# Patient Record
Sex: Female | Born: 1990 | Race: Black or African American | Hispanic: No | Marital: Single | Smoking: Never smoker
Health system: Southern US, Community
[De-identification: ages and names within clinical notes are randomized; demographics above are authoritative.]

## PROBLEM LIST (undated history)

## (undated) ENCOUNTER — Inpatient Hospital Stay (HOSPITAL_COMMUNITY): Payer: Self-pay

## (undated) DIAGNOSIS — J45909 Unspecified asthma, uncomplicated: Secondary | ICD-10-CM

## (undated) HISTORY — DX: Unspecified asthma, uncomplicated: J45.909

---
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
## 2012-06-13 ENCOUNTER — Emergency Department (HOSPITAL_COMMUNITY)
Admission: EM | Admit: 2012-06-13 | Discharge: 2012-06-14 | Disposition: A | Discharge status: Home / Self Care | Payer: 59 | Attending: Emergency Medicine | Admitting: Emergency Medicine

## 2012-06-13 ENCOUNTER — Emergency Department (HOSPITAL_COMMUNITY): Payer: 59

## 2012-06-13 ENCOUNTER — Encounter (HOSPITAL_COMMUNITY): Payer: Self-pay | Admitting: Emergency Medicine

## 2012-06-13 DIAGNOSIS — S199XXA Unspecified injury of neck, initial encounter: Secondary | ICD-10-CM | POA: Insufficient documentation

## 2012-06-13 DIAGNOSIS — S0993XA Unspecified injury of face, initial encounter: Secondary | ICD-10-CM | POA: Insufficient documentation

## 2012-06-13 DIAGNOSIS — Y9389 Activity, other specified: Secondary | ICD-10-CM | POA: Insufficient documentation

## 2012-06-13 DIAGNOSIS — Z8739 Personal history of other diseases of the musculoskeletal system and connective tissue: Secondary | ICD-10-CM | POA: Insufficient documentation

## 2012-06-13 DIAGNOSIS — S8990XA Unspecified injury of unspecified lower leg, initial encounter: Secondary | ICD-10-CM | POA: Insufficient documentation

## 2012-06-13 DIAGNOSIS — S79919A Unspecified injury of unspecified hip, initial encounter: Secondary | ICD-10-CM | POA: Insufficient documentation

## 2012-06-13 DIAGNOSIS — Y92410 Unspecified street and highway as the place of occurrence of the external cause: Secondary | ICD-10-CM | POA: Insufficient documentation

## 2012-06-13 MED ORDER — HYDROCODONE-ACETAMINOPHEN 5-325 MG PO TABS: 1.0000 | ORAL_TABLET | Freq: Four times a day (QID) | ORAL | Status: DC | PRN
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
## 2012-06-13 NOTE — ED Notes (Signed)
Rx x 1.  Pt voiced understanding to f/u with PCP and return for worsening condition.  
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
## 2012-06-15 NOTE — ED Provider Notes (Signed)
Medical screening examination/treatment/procedure(s) were performed by non-physician practitioner and as supervising physician I was immediately available for consultation/collaboration.    Anahli Arvanitis W. Deyanira Fesler, MD 06/15/12 1315 
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
## 2014-01-25 ENCOUNTER — Ambulatory Visit (INDEPENDENT_AMBULATORY_CARE_PROVIDER_SITE_OTHER): Payer: 59 | Admitting: Emergency Medicine

## 2014-01-25 VITALS — BP 110/80 | HR 93 | Temp 98.0°F | Resp 16 | Ht 64.5 in | Wt 97.2 lb

## 2014-01-25 DIAGNOSIS — M25521 Pain in right elbow: Secondary | ICD-10-CM

## 2014-01-25 DIAGNOSIS — L02419 Cutaneous abscess of limb, unspecified: Secondary | ICD-10-CM

## 2014-01-25 DIAGNOSIS — M79621 Pain in right upper arm: Secondary | ICD-10-CM

## 2014-01-25 MED ORDER — SULFAMETHOXAZOLE-TRIMETHOPRIM 400-80 MG PO TABS: 1.0000 | ORAL_TABLET | Freq: Two times a day (BID) | ORAL | Status: DC

## 2014-01-25 NOTE — Patient Instructions (Signed)
Continue the antibiotic as prescribed. Apply a warm compress to the area for 15-20 minutes 2-4 times each day. Change the dressing if it becomes saturated, leaks or falls off.  

## 2014-01-25 NOTE — Progress Notes (Signed)
Verbal Consent Obtained. Local anesthesia with 1 cc of 2% lidocaine plain.  11 blade used to incise the lesion centrally.  Purulence expressed. Irrigated wound with 3 cc of 2% lidocaine and packed with 1/4 inch plain packing.  Cleansed and dressed.

## 2014-01-25 NOTE — Progress Notes (Signed)
[facility] [address], [place] [address] [phone]  Date:  01/25/2014   Name:  Debra Schneider   DOB:  June 13, 1990   MRN:  [id number]  PCP:  No PCP Per Patient    Chief Complaint: Abscess   History of Present Illness:  Debra Schneider is a 23 y.o. very pleasant female patient who presents with the following:  Painful mass on right upper arm for past week No fever or chills.  No history of injury No improvement with over the counter medications or other home remedies.  Denies other complaint or health concern today.   There are no active problems to display for this patient.   Past Medical History  Diagnosis Date  . Arthritis   . Asthma     History reviewed. No pertinent past surgical history.  History  Substance Use Topics  . Smoking status: Never Smoker   . Smokeless tobacco: Not on file  . Alcohol Use: 0.0 oz/week    0 Not specified per week     Comment: weekends    History reviewed. No pertinent family history.  No Known Allergies  Medication list has been reviewed and updated.  No current outpatient prescriptions on file prior to visit.   No current facility-administered medications on file prior to visit.    Review of Systems:  As per HPI, otherwise negative.    Physical Examination: Filed Vitals:   01/25/14 1847  BP: 110/80  Pulse: 93  Temp: 98 F (36.7 C)  Resp: 16   Filed Vitals:   01/25/14 1847  Height: 5' 4.5" (1.638 m)  Weight: 97 lb 3.2 oz (44.09 kg)   Body mass index is 16.43 kg/(m^2). Ideal Body Weight: Weight in (lb) to have BMI = 25: 147.6   GEN: WDWN, NAD, Non-toxic, Alert & Oriented x 3 HEENT: Atraumatic, Normocephalic.  Ears and Nose: No external deformity. EXTR: No clubbing/cyanosis/edema NEURO: Normal gait.  PSYCH: Normally interactive. Conversant. Not depressed or anxious appearing.  Calm demeanor.  Abscess right upper arm  Assessment and Plan: Pain arm Abscess  arm Septra  Signed,  Phillips OdorJeffery Kensley Lares, MD

## 2014-01-27 ENCOUNTER — Ambulatory Visit (INDEPENDENT_AMBULATORY_CARE_PROVIDER_SITE_OTHER): Payer: 59 | Admitting: Physician Assistant

## 2014-01-27 VITALS — BP 100/64 | HR 98 | Temp 98.4°F | Ht 64.5 in | Wt 95.0 lb

## 2014-01-27 DIAGNOSIS — L02419 Cutaneous abscess of limb, unspecified: Secondary | ICD-10-CM

## 2014-01-27 NOTE — Progress Notes (Signed)
   Subjective:    Patient ID: Mcarthur RossettiBianca M Burress, female    DOB: 06-29-1990, 23 y.o.   MRN: [id number]   PCP: No PCP Per Patient  Chief Complaint  Patient presents with  . Wound Check    No Known Allergies  There are no active problems to display for this patient.   Prior to Admission medications   Medication Sig Start Date End Date Taking? Authorizing Provider  sulfamethoxazole-trimethoprim (BACTRIM) 400-80 MG per tablet Take 1 tablet by mouth 2 (two) times daily. 01/25/14   Carmelina DaneJeffery S Anderson, MD    Medical, Surgical, Family and Social History reviewed and updated.  HPI Patient presents for wound care, s/p I&D of an abscess on the RIGHT upper arm 2 days ago.  She's doing well, tolerating the antibiotic.  She's a bit sore at the site, "From everybody wanting to hug me" yesterday at the Thanksgiving meal. No fever or chills. No GI symptoms. The bandage is a bit itchy.   Review of Systems     Objective:   Physical Exam  BP 100/64 mmHg  Pulse 98  Temp(Src) 98.4 F (36.9 C) (Oral)  Ht 5' 4.5" (1.638 m)  Wt 95 lb (43.092 kg)  BMI 16.06 kg/m2  SpO2 100%  LMP 01/13/2014 WDWNBF, A&O x 3. Dressing and packing removed. Purulence expressed. Moderate yellow eschar on the anterior wall of the wound cavity removed with pick ups. Wound cavity is bloody. Gently repacked with 1/4 inch plain packing. Dressed.      Assessment & Plan:  1. Abscess of arm Continue current treatment. RTC 2 days for wound care.   Fernande Brashelle S. Linkoln Alkire, PA-C Physician Assistant-Certified [facility]

## 2014-01-29 ENCOUNTER — Ambulatory Visit (INDEPENDENT_AMBULATORY_CARE_PROVIDER_SITE_OTHER): Payer: 59 | Admitting: Physician Assistant

## 2014-01-29 VITALS — BP 102/70 | HR 97 | Temp 98.4°F | Resp 16

## 2014-01-29 DIAGNOSIS — L02419 Cutaneous abscess of limb, unspecified: Secondary | ICD-10-CM

## 2014-01-29 LAB — WOUND CULTURE
GRAM STAIN: NONE SEEN
Gram Stain: NONE SEEN

## 2014-01-29 NOTE — Progress Notes (Signed)
I have examined this patient along with Mr. McVeigh, PA-C and agree.  

## 2014-01-29 NOTE — Progress Notes (Signed)
   Subjective:    Patient ID: Mcarthur RossettiBianca M Meter, female    DOB: [birth year], 23 y.o.   MRN: [id number]  HPI  [age] yof presents for return visit for wound care.   Seen here 4 days ago, right upper arm abscess was I&Dd and packed. Started on bactrim. 2 days ago seen again, healing well, re-packed.   Today she states has been doing well. Occasionally doing warm compresses. No pain other than when family hugs her (Thanksgiving). Has not changed the dressing. Denies fever, chills. Taking bactrim as prescribed.   Review of Systems See HPI    Objective:   Physical Exam  Constitutional:  BP 102/70 mmHg  Pulse 97  Temp(Src) 98.4 F (36.9 C) (Oral)  Resp 16  SpO2 98%  LMP 01/13/2014   Skin:  Packing removed from wound. No purulence expressed. Small amnt eschar distal portion of wound opening. Removed. Wound still bloody. No surrounding erythema. No fluctuance. 4 cm x 2 cm surrounding induration.       Assessment & Plan:   [age] yof presents for return visit for wound care.   Abscess of arm --Healing well --Packing removed, no sign ongoing infx --Cleaned and dressed, not repacked --Wound care instructions given --continue abx --rtc if purulence, fever/chills, increased pain  Donnajean Lopesodd M. Rosalee Tolley, PA-C Physician Assistant-Certified [facility]  01/29/2014 12:11 PM

## 2014-01-29 NOTE — Patient Instructions (Signed)
Your wound looks good! There was no pus today.  We didn't repack the wound today.  Make sure you are taking the antibiotic twice daily until the course is done.  Keep the bandage on for the next 24-48 hours. Wash the wound when you remove it, then you can use bandaids moving forward.  Come back in if you notice pus coming from it, or start to have increased pain or fever/chills.

## 2015-08-27 ENCOUNTER — Emergency Department (HOSPITAL_BASED_OUTPATIENT_CLINIC_OR_DEPARTMENT_OTHER)
Admission: EM | Admit: 2015-08-27 | Discharge: 2015-08-27 | Disposition: A | Discharge status: Home / Self Care | Payer: Managed Care, Other (non HMO) | Attending: Emergency Medicine | Admitting: Emergency Medicine

## 2015-08-27 ENCOUNTER — Encounter (HOSPITAL_BASED_OUTPATIENT_CLINIC_OR_DEPARTMENT_OTHER): Payer: Self-pay

## 2015-08-27 DIAGNOSIS — Y92410 Unspecified street and highway as the place of occurrence of the external cause: Secondary | ICD-10-CM | POA: Insufficient documentation

## 2015-08-27 DIAGNOSIS — M1990 Unspecified osteoarthritis, unspecified site: Secondary | ICD-10-CM | POA: Insufficient documentation

## 2015-08-27 DIAGNOSIS — Y9389 Activity, other specified: Secondary | ICD-10-CM | POA: Diagnosis not present

## 2015-08-27 DIAGNOSIS — S0993XA Unspecified injury of face, initial encounter: Secondary | ICD-10-CM | POA: Diagnosis present

## 2015-08-27 DIAGNOSIS — J45909 Unspecified asthma, uncomplicated: Secondary | ICD-10-CM | POA: Insufficient documentation

## 2015-08-27 DIAGNOSIS — S0083XA Contusion of other part of head, initial encounter: Secondary | ICD-10-CM | POA: Insufficient documentation

## 2015-08-27 DIAGNOSIS — Y999 Unspecified external cause status: Secondary | ICD-10-CM | POA: Insufficient documentation

## 2015-08-27 MED ORDER — NAPROXEN 375 MG PO TABS: 375.0000 mg | ORAL_TABLET | Freq: Two times a day (BID) | ORAL | Status: AC

## 2015-08-27 MED ORDER — CYCLOBENZAPRINE HCL 5 MG PO TABS: 5.0000 mg | ORAL_TABLET | Freq: Two times a day (BID) | ORAL | Status: AC | PRN
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
## 2015-11-20 ENCOUNTER — Inpatient Hospital Stay (HOSPITAL_COMMUNITY): Payer: Self-pay

## 2015-11-20 ENCOUNTER — Encounter (HOSPITAL_COMMUNITY): Payer: Self-pay | Admitting: *Deleted

## 2015-11-20 ENCOUNTER — Inpatient Hospital Stay (HOSPITAL_COMMUNITY)
Admission: AD | Admit: 2015-11-20 | Discharge: 2015-11-20 | Disposition: A | Discharge status: Home / Self Care | Payer: Self-pay | Source: Ambulatory Visit | Attending: Obstetrics and Gynecology | Admitting: Obstetrics and Gynecology

## 2015-11-20 DIAGNOSIS — O209 Hemorrhage in early pregnancy, unspecified: Secondary | ICD-10-CM

## 2015-11-20 DIAGNOSIS — O021 Missed abortion: Secondary | ICD-10-CM | POA: Insufficient documentation

## 2015-11-20 DIAGNOSIS — Z3A10 10 weeks gestation of pregnancy: Secondary | ICD-10-CM | POA: Insufficient documentation

## 2015-11-20 LAB — WET PREP, GENITAL
SPERM: NONE SEEN
TRICH WET PREP: NONE SEEN
YEAST WET PREP: NONE SEEN

## 2015-11-20 LAB — URINALYSIS, ROUTINE W REFLEX MICROSCOPIC
BILIRUBIN URINE: NEGATIVE
Glucose, UA: NEGATIVE mg/dL
Hgb urine dipstick: NEGATIVE
Ketones, ur: 15 mg/dL — AB
Leukocytes, UA: NEGATIVE
NITRITE: NEGATIVE
PH: 5.5 (ref 5.0–8.0)
Protein, ur: NEGATIVE mg/dL

## 2015-11-20 LAB — CBC
HEMATOCRIT: 39.7 % (ref 36.0–46.0)
HEMOGLOBIN: 13.8 g/dL (ref 12.0–15.0)
MCH: 26.5 pg (ref 26.0–34.0)
MCHC: 34.8 g/dL (ref 30.0–36.0)
MCV: 76.2 fL — AB (ref 78.0–100.0)
PLATELETS: 245 10*3/uL (ref 150–400)
RBC: 5.21 MIL/uL — AB (ref 3.87–5.11)
RDW: 13.1 % (ref 11.5–15.5)
WBC: 8.2 10*3/uL (ref 4.0–10.5)

## 2015-11-20 LAB — HCG, QUANTITATIVE, PREGNANCY: hCG, Beta Chain, Quant, S: 5900 m[IU]/mL — ABNORMAL HIGH (ref <5)

## 2015-11-20 LAB — POCT PREGNANCY, URINE: Preg Test, Ur: POSITIVE — AB

## 2015-11-20 LAB — ABO/RH: ABO/RH(D): O POS
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
## 2015-11-20 NOTE — [facility] Provider Note (Signed)
History     CSN: [id number]  Arrival date and time: 11/20/15 1533   First Provider Initiated Contact with Patient 11/20/15 1747      Chief Complaint  Patient presents with  . Vaginal Bleeding   HPI Debra Schneider is a 25 y.o. G1P0 at [redacted]w[redacted]d by LMP who presents with vaginal bleeding. Reports pink spotting on toilet paper when she voids today. Denies heavy vaginal bleeding or abdominal pain. Last intercourse 2 days ago. Denies n/v/d, constipation, dysuria, or vaginal discharge.   OB History    Gravida Para Term Preterm AB Living   1             SAB TAB Ectopic Multiple Live Births                  Past Medical History:  Diagnosis Date  . Asthma     History reviewed. No pertinent surgical history.  History reviewed. No pertinent family history.  Social History  Substance Use Topics  . Smoking status: Never Smoker  . Smokeless tobacco: Not on file  . Alcohol use 0.0 oz/week     Comment: social    Allergies: No Known Allergies  Prescriptions Prior to Admission  Medication Sig Dispense Refill Last Dose  . cyclobenzaprine (FLEXERIL) 5 MG tablet Take 1 tablet (5 mg total) by mouth 2 (two) times daily as needed for muscle spasms. 12 tablet 0   . naproxen (NAPROSYN) 375 MG tablet Take 1 tablet (375 mg total) by mouth 2 (two) times daily. 20 tablet 0     Review of Systems  Constitutional: Negative.   Gastrointestinal: Negative.   Genitourinary: Negative for dysuria.       + vaginal spotting   Physical Exam   Blood pressure 133/67, pulse 91, temperature 98.7 F (37.1 C), temperature source Oral, resp. rate 16, height 5\' 2"  (1.575 m), weight 102 lb (46.3 kg), last menstrual period 09/11/2015.  Physical Exam  Nursing note and vitals reviewed. Constitutional: She is oriented to person, place, and time. She appears well-developed and well-nourished. No distress.  HENT:  Head: Normocephalic and atraumatic.  Eyes: Conjunctivae are normal. Right eye exhibits no  discharge. Left eye exhibits no discharge. No scleral icterus.  Neck: Normal range of motion.  Cardiovascular: Normal rate, regular rhythm and normal heart sounds.   No murmur heard. Respiratory: Effort normal and breath sounds normal. No respiratory distress. She has no wheezes.  GI: Soft. She exhibits no distension. There is no tenderness.  Genitourinary: Uterus normal. Cervix exhibits no motion tenderness and no friability. Right adnexum displays no mass and no tenderness. Left adnexum displays no mass and no tenderness. Vaginal discharge (small amount of pink discharge, no active bleeding) found.  Genitourinary Comments: Cervix closed  Neurological: She is alert and oriented to person, place, and time.  Skin: Skin is warm and dry. She is not diaphoretic.  Psychiatric: She has a normal mood and affect. Her behavior is normal. Judgment and thought content normal.    MAU Course  Procedures Results for orders placed or performed during the hospital encounter of 11/20/15 (from the past 24 hour(s))  Urinalysis, Routine w reflex microscopic (not at [facility])     Status: Abnormal   Collection Time: 11/20/15  3:47 PM  Result Value Ref Range   Color, Urine YELLOW YELLOW   APPearance CLEAR CLEAR   Specific Gravity, Urine >1.030 (H) 1.005 - 1.030   pH 5.5 5.0 - 8.0   Glucose, UA NEGATIVE NEGATIVE mg/dL  Hgb urine dipstick NEGATIVE NEGATIVE   Bilirubin Urine NEGATIVE NEGATIVE   Ketones, ur 15 (A) NEGATIVE mg/dL   Protein, ur NEGATIVE NEGATIVE mg/dL   Nitrite NEGATIVE NEGATIVE   Leukocytes, UA NEGATIVE NEGATIVE  Pregnancy, urine POC     Status: Abnormal   Collection Time: 11/20/15  4:44 PM  Result Value Ref Range   Preg Test, Ur POSITIVE (A) NEGATIVE  CBC     Status: Abnormal   Collection Time: 11/20/15  5:13 PM  Result Value Ref Range   WBC 8.2 4.0 - 10.5 K/uL   RBC 5.21 (H) 3.87 - 5.11 MIL/uL   Hemoglobin 13.8 12.0 - 15.0 g/dL   HCT 81.1 91.4 - 78.2 %   MCV 76.2 (L) 78.0 - 100.0 fL    MCH 26.5 26.0 - 34.0 pg   MCHC 34.8 30.0 - 36.0 g/dL   RDW 95.6 21.3 - 08.6 %   Platelets 245 150 - 400 K/uL  ABO/Rh     Status: None (Preliminary result)   Collection Time: 11/20/15  5:13 PM  Result Value Ref Range   ABO/RH(D) O POS   hCG, quantitative, pregnancy     Status: Abnormal   Collection Time: 11/20/15  5:13 PM  Result Value Ref Range   hCG, Beta Chain, Quant, S 5,900 (H) <5 mIU/mL  Wet prep, genital     Status: Abnormal   Collection Time: 11/20/15  5:55 PM  Result Value Ref Range   Yeast Wet Prep HPF POC NONE SEEN NONE SEEN   Trich, Wet Prep NONE SEEN NONE SEEN   Clue Cells Wet Prep HPF POC PRESENT (A) NONE SEEN   WBC, Wet Prep HPF POC MODERATE (A) NONE SEEN   Sperm NONE SEEN    US Ob Comp Less 14 Wks  Result Date: 11/20/2015 CLINICAL DATA:  Acute onset of vaginal bleeding.  Initial encounter. EXAM: OBSTETRIC <14 WK [place] AND TRANSVAGINAL OB US TECHNIQUE: Both transabdominal and transvaginal ultrasound examinations were performed for complete evaluation of the gestation as well as the maternal uterus, adnexal regions, and pelvic cul-de-sac. Transvaginal technique was performed to assess early pregnancy. COMPARISON:  Pelvic radiograph performed 06/13/2012 FINDINGS: Intrauterine gestational sac: Single; visualized and normal in shape. Yolk sac:  No Embryo:  Yes Cardiac Activity: No Heart Rate: N/A CRL:  1.28 cm   7 w   3 d Subchorionic hemorrhage:  None visualized. Maternal uterus/adnexae: The uterus is otherwise unremarkable in appearance. A small amount of free fluid is noted at the cervical canal, likely corresponding to the patient's vaginal bleeding. The ovaries are unremarkable in appearance. The right ovary measures 3.8 x 1.4 x 1.6 cm, while the left ovary measures 2.8 x 2.4 x 2.3 cm. No suspicious adnexal masses are seen; there is no evidence for ovarian torsion No free fluid is seen within the pelvic cul-de-sac. IMPRESSION: Single intrauterine pregnancy noted, with a crown-rump  length of 1.3 cm, without evidence of cardiac activity, compatible with fetal demise. If quantitative HCG level continues to rise, follow-up pelvic ultrasound could be considered. Electronically Signed   By: Roanna Raider M.D.   On: 11/20/2015 19:36   US Ob Transvaginal  Result Date: 11/20/2015 CLINICAL DATA:  Acute onset of vaginal bleeding.  Initial encounter. EXAM: OBSTETRIC <14 WK [place] AND TRANSVAGINAL OB US TECHNIQUE: Both transabdominal and transvaginal ultrasound examinations were performed for complete evaluation of the gestation as well as the maternal uterus, adnexal regions, and pelvic cul-de-sac. Transvaginal technique was performed to assess early pregnancy.  COMPARISON:  Pelvic radiograph performed 06/13/2012 FINDINGS: Intrauterine gestational sac: Single; visualized and normal in shape. Yolk sac:  No Embryo:  Yes Cardiac Activity: No Heart Rate: N/A CRL:  1.28 cm   7 w   3 d Subchorionic hemorrhage:  None visualized. Maternal uterus/adnexae: The uterus is otherwise unremarkable in appearance. A small amount of free fluid is noted at the cervical canal, likely corresponding to the patient's vaginal bleeding. The ovaries are unremarkable in appearance. The right ovary measures 3.8 x 1.4 x 1.6 cm, while the left ovary measures 2.8 x 2.4 x 2.3 cm. No suspicious adnexal masses are seen; there is no evidence for ovarian torsion No free fluid is seen within the pelvic cul-de-sac. IMPRESSION: Single intrauterine pregnancy noted, with a crown-rump length of 1.3 cm, without evidence of cardiac activity, compatible with fetal demise. If quantitative HCG level continues to rise, follow-up pelvic ultrasound could be considered. Electronically Signed   By: Roanna RaiderJeffery  Chang M.D.   On: 11/20/2015 19:36    MDM +UPT UA, wet prep, GC/chlamydia, CBC, ABO/Rh, quant hCG, HIV, and US today to rule out ectopic pregnancy O positive Ultrasound shows IUP with no cardiac activity compatible with fetal demise Discussed  options for management of incomplete AB including expectant management, Cytotec or D&C. Prefers expectant management at this time. Verbalizes understanding that intervention may become necessary if SAB in not completed spontaneously or if heavy bleeding or infection occur.   Assessment and Plan  A: 1. Missed abortion   2. Bleeding in early pregnancy    P: Discharge home Discussed reasons to return to [facility] GC/CT pending [facility] Pelvic rest Comfort care given  Judeth Hornrin Zebbie Ace 11/20/2015, 5:41 PM

## 2015-11-20 NOTE — Discharge Instructions (Signed)
°Return to care  °· If you have heavier bleeding that soaks through more that 2 pads per hour for an hour or more °· If you bleed so much that you feel like you might pass out or you do pass out °· If you have significant abdominal pain that is not improved with Tylenol  °· If you develop a fever > 100.5 ° ° ° ° °Miscarriage °A miscarriage is the sudden loss of an unborn baby (fetus) before the 20th week of pregnancy. Most miscarriages happen in the first 3 months of pregnancy. Sometimes, it happens before a woman even knows she is pregnant. A miscarriage is also called a "spontaneous miscarriage" or "early pregnancy loss." Having a miscarriage can be an emotional experience. Talk with your caregiver about any questions you may have about miscarrying, the grieving process, and your future pregnancy plans. °CAUSES  °· Problems with the fetal chromosomes that make it impossible for the baby to develop normally. Problems with the baby's genes or chromosomes are most often the result of errors that occur, by chance, as the embryo divides and grows. The problems are not inherited from the parents. °· Infection of the cervix or uterus.   °· Hormone problems.   °· Problems with the cervix, such as having an incompetent cervix. This is when the tissue in the cervix is not strong enough to hold the pregnancy.   °· Problems with the uterus, such as an abnormally shaped uterus, uterine fibroids, or congenital abnormalities.   °· Certain medical conditions.   °· Smoking, drinking alcohol, or taking illegal drugs.   °· Trauma.   °Often, the cause of a miscarriage is unknown.  °SYMPTOMS  °· Vaginal bleeding or spotting, with or without cramps or pain. °· Pain or cramping in the abdomen or lower back. °· Passing fluid, tissue, or blood clots from the vagina. °DIAGNOSIS  °Your caregiver will perform a physical exam. You may also have an ultrasound to confirm the miscarriage. Blood or urine tests may also be ordered. °TREATMENT   °· Sometimes, treatment is not necessary if you naturally pass all the fetal tissue that was in the uterus. If some of the fetus or placenta remains in the body (incomplete miscarriage), tissue left behind may become infected and must be removed. Usually, a dilation and curettage (D and C) procedure is performed. During a D and C procedure, the cervix is widened (dilated) and any remaining fetal or placental tissue is gently removed from the uterus. °· Antibiotic medicines are prescribed if there is an infection. Other medicines may be given to reduce the size of the uterus (contract) if there is a lot of bleeding. °· If you have Rh negative blood and your baby was Rh positive, you will need a Rh immunoglobulin shot. This shot will protect any future baby from having Rh blood problems in future pregnancies. °HOME CARE INSTRUCTIONS  °· Your caregiver may order bed rest or may allow you to continue light activity. Resume activity as directed by your caregiver. °· Have someone help with home and family responsibilities during this time.   °· Keep track of the number of sanitary pads you use each day and how soaked (saturated) they are. Write down this information.   °· Do not use tampons. Do not douche or have sexual intercourse until approved by your caregiver.   °· Only take over-the-counter or prescription medicines for pain or discomfort as directed by your caregiver.   °· Do not take aspirin. Aspirin can cause bleeding.   °· Keep all follow-up appointments with   your caregiver.   °· If you or your partner have problems with grieving, talk to your caregiver or seek counseling to help cope with the pregnancy loss. Allow enough time to grieve before trying to get pregnant again.   °SEEK IMMEDIATE MEDICAL CARE IF:  °· You have severe cramps or pain in your back or abdomen. °· You have a fever. °· You pass large blood clots (walnut-sized or larger) or tissue from your vagina. Save any tissue for your caregiver to  inspect.   °· Your bleeding increases.   °· You have a thick, bad-smelling vaginal discharge. °· You become lightheaded, weak, or you faint.   °· You have chills.   °MAKE SURE YOU: °· Understand these instructions. °· Will watch your condition. °· Will get help right away if you are not doing well or get worse. °  °This information is not intended to replace advice given to you by your health care provider. Make sure you discuss any questions you have with your health care provider. °  °Document Released: 08/13/2000 Document Revised: 06/14/2012 Document Reviewed: 04/08/2011 °[employer] Interactive Patient Education ©2016 [employer] ° °

## 2015-11-21 LAB — GC/CHLAMYDIA PROBE AMP (~~LOC~~) NOT AT ARMC
Chlamydia: NEGATIVE
NEISSERIA GONORRHEA: NEGATIVE

## 2015-11-21 LAB — HIV ANTIBODY (ROUTINE TESTING W REFLEX): HIV SCREEN 4TH GENERATION: NONREACTIVE

## 2015-12-11 ENCOUNTER — Encounter: Payer: Managed Care, Other (non HMO) | Admitting: Advanced Practice Midwife

## 2015-12-18 ENCOUNTER — Encounter: Payer: Managed Care, Other (non HMO) | Admitting: Advanced Practice Midwife

## 2016-09-24 ENCOUNTER — Encounter (HOSPITAL_COMMUNITY): Payer: Self-pay
